# Patient Record
Sex: Female | Born: 1975 | Race: White | Hispanic: No | Marital: Single | Smoking: Never smoker
Health system: Southern US, Community
[De-identification: ages and names within clinical notes are randomized; demographics above are authoritative.]

## PROBLEM LIST (undated history)

## (undated) DIAGNOSIS — R0609 Other forms of dyspnea: Secondary | ICD-10-CM

## (undated) DIAGNOSIS — Z8249 Family history of ischemic heart disease and other diseases of the circulatory system: Principal | ICD-10-CM

## (undated) DIAGNOSIS — E039 Hypothyroidism, unspecified: Secondary | ICD-10-CM

## (undated) DIAGNOSIS — I491 Atrial premature depolarization: Secondary | ICD-10-CM

## (undated) HISTORY — DX: Other forms of dyspnea: R06.09

## (undated) HISTORY — DX: Family history of ischemic heart disease and other diseases of the circulatory system: Z82.49

## (undated) HISTORY — DX: Hypothyroidism, unspecified: E03.9

## (undated) HISTORY — DX: Atrial premature depolarization: I49.1

---

## 2001-11-10 HISTORY — PX: BREAST ENHANCEMENT SURGERY: SHX7

## 2012-05-12 ENCOUNTER — Emergency Department
Admission: EM | Admit: 2012-05-12 | Discharge: 2012-05-12 | Disposition: A | Discharge status: Home / Self Care | Payer: Self-pay | Source: Home / Self Care

## 2014-06-29 ENCOUNTER — Other Ambulatory Visit: Payer: Self-pay | Admitting: Obstetrics and Gynecology

## 2014-06-29 DIAGNOSIS — N644 Mastodynia: Secondary | ICD-10-CM

## 2014-07-03 ENCOUNTER — Ambulatory Visit
Admission: RE | Admit: 2014-07-03 | Discharge: 2014-07-03 | Disposition: A | Discharge status: Home / Self Care | Payer: BC Managed Care – PPO | Source: Ambulatory Visit | Attending: Obstetrics and Gynecology | Admitting: Obstetrics and Gynecology

## 2014-07-03 DIAGNOSIS — N644 Mastodynia: Secondary | ICD-10-CM

## 2014-07-10 ENCOUNTER — Other Ambulatory Visit: Payer: Self-pay | Admitting: Obstetrics and Gynecology

## 2014-07-10 DIAGNOSIS — Z9882 Breast implant status: Secondary | ICD-10-CM

## 2014-07-10 DIAGNOSIS — N63 Unspecified lump in unspecified breast: Secondary | ICD-10-CM

## 2014-07-11 ENCOUNTER — Other Ambulatory Visit: Payer: Self-pay | Admitting: Obstetrics and Gynecology

## 2014-07-11 DIAGNOSIS — N63 Unspecified lump in unspecified breast: Secondary | ICD-10-CM

## 2014-10-01 ENCOUNTER — Other Ambulatory Visit: Payer: BC Managed Care – PPO

## 2014-10-02 ENCOUNTER — Other Ambulatory Visit: Payer: BC Managed Care – PPO

## 2016-01-31 ENCOUNTER — Other Ambulatory Visit: Payer: Self-pay | Admitting: Obstetrics and Gynecology

## 2016-01-31 DIAGNOSIS — Z1231 Encounter for screening mammogram for malignant neoplasm of breast: Secondary | ICD-10-CM

## 2016-02-20 ENCOUNTER — Ambulatory Visit: Payer: Self-pay

## 2016-02-28 ENCOUNTER — Ambulatory Visit: Payer: Self-pay | Admitting: Cardiology

## 2016-03-19 ENCOUNTER — Ambulatory Visit
Admission: RE | Admit: 2016-03-19 | Discharge: 2016-03-19 | Disposition: A | Discharge status: Home / Self Care | Payer: BLUE CROSS/BLUE SHIELD | Source: Ambulatory Visit | Attending: Obstetrics and Gynecology | Admitting: Obstetrics and Gynecology

## 2016-03-19 DIAGNOSIS — Z1231 Encounter for screening mammogram for malignant neoplasm of breast: Secondary | ICD-10-CM | POA: Diagnosis not present

## 2016-03-24 ENCOUNTER — Ambulatory Visit (INDEPENDENT_AMBULATORY_CARE_PROVIDER_SITE_OTHER): Payer: BLUE CROSS/BLUE SHIELD | Admitting: Cardiology

## 2016-03-24 ENCOUNTER — Telehealth: Payer: Self-pay | Admitting: Cardiology

## 2016-03-24 ENCOUNTER — Encounter: Payer: Self-pay | Admitting: Cardiology

## 2016-03-24 VITALS — BP 102/70 | HR 60 | Ht 68.0 in | Wt 147.0 lb

## 2016-03-24 DIAGNOSIS — R0602 Shortness of breath: Secondary | ICD-10-CM | POA: Diagnosis not present

## 2016-03-24 DIAGNOSIS — Z8249 Family history of ischemic heart disease and other diseases of the circulatory system: Secondary | ICD-10-CM | POA: Diagnosis not present

## 2016-03-24 DIAGNOSIS — I498 Other specified cardiac arrhythmias: Secondary | ICD-10-CM | POA: Diagnosis not present

## 2016-03-24 DIAGNOSIS — R0600 Dyspnea, unspecified: Secondary | ICD-10-CM

## 2016-03-24 DIAGNOSIS — R0609 Other forms of dyspnea: Secondary | ICD-10-CM | POA: Diagnosis not present

## 2016-03-24 DIAGNOSIS — I491 Atrial premature depolarization: Secondary | ICD-10-CM

## 2016-03-24 HISTORY — DX: Dyspnea, unspecified: R06.00

## 2016-03-24 HISTORY — DX: Family history of ischemic heart disease and other diseases of the circulatory system: Z82.49

## 2016-03-24 HISTORY — DX: Atrial premature depolarization: I49.1

## 2016-03-24 HISTORY — DX: Other forms of dyspnea: R06.09

## 2016-03-24 NOTE — Progress Notes (Addendum)
Cardiology Office Note    Date:  03/24/2016   ID:  Sara Zimmerman, DOB December 30, 1975, MRN [id number]  PCP:  Damien Fusi, NP  Cardiologist:  Fransico Him, MD   Chief Complaint  Patient presents with  . New Evaluation    familiy history of CAD, cousin died at 73 of sudden heart attack    History of Present Illness:  Sara Zimmerman is a 40 y.o. female with no prior cardiac history who presents today for evaluation of family history of CAD.  She has a strong family history of premature CAD with her cousin having SCD at age 88.  Her maternal grandmother died at 28 of an MI.  Her maternal uncle died at 63 as well.  She does not think either of them smoked.  She has never smoked either.   She denies any chest pain,  LE edema, dizziness, palpitations or syncope.  She does have some problems with SOB but she thinks it is related to stress and work but does notice it some with exercise.      Past Medical History  Diagnosis Date  . Family history of premature CAD 03/24/2016  . Hypothyroidism   . DOE (dyspnea on exertion) 03/24/2016  . 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 Breast enhancement surgery  2003    Current Medications: Outpatient Prescriptions Prior to Visit  Medication Sig Dispense Refill  . levothyroxine (SYNTHROID, LEVOTHROID) 50 MCG tablet Take 50 mcg by mouth daily before breakfast.     No facility-administered medications prior to visit.     Allergies:   Review of patient's allergies indicates no known allergies.   Social History   Social History  . Marital Status: Single    Spouse Name: N/A  . Number of Children: N/A  . Years of Education: N/A   Social History Main Topics  . Smoking status: Never Smoker   . Smokeless tobacco: None  . Alcohol Use: 0.0 oz/week    0 Standard drinks or equivalent per week  . Drug Use: None  . Sexual Activity: Yes   Other Topics Concern  . None   Social History Narrative     Family History:  The patient's family history includes Heart attack in her cousin and maternal grandmother; Heart disease in her maternal grandmother and mother.   ROS:   Please see the history of present illness.    ROS All other systems reviewed and are negative.   PHYSICAL EXAM:   VS:  BP 102/70 mmHg  Pulse 60  Ht 5' 8"  (1.727 m)  Wt 147 lb (66.679 kg)  BMI 22.36 kg/m2   GEN: Well nourished, well developed, in no acute distress HEENT: normal Neck: no JVD, carotid bruits, or masses Cardiac: RRR; no murmurs, rubs, or gallops,no edema.  Intact distal pulses bilaterally.  Respiratory:  clear to auscultation bilaterally, normal work of breathing GI: soft, nontender, nondistended, + BS MS: no deformity or atrophy Skin: warm and dry, no rash Neuro:  Alert and Oriented x 3, Strength and sensation are intact Psych: euthymic mood, full affect  Wt Readings from Last 3 Encounters:  03/24/16 147 lb (66.679 kg)      Studies/Labs Reviewed:   EKG:  EKG is ordered today.  The ekg ordered today demonstrates Ectopic atrial rhythm at 60bpm with no ST changes  Recent Labs: No results found for requested labs within last 365 days.   Lipid Panel No results found for:  CHOL, TRIG, HDL, CHOLHDL, VLDL, LDLCALC, LDLDIRECT  Additional studies/ records that were reviewed today include:  OV notes from PCP    ASSESSMENT:    1. Family history of premature CAD   2. DOE (dyspnea on exertion)   3. Ectopic atrial rhythm      PLAN:  In order of problems listed above:  1. Family history of premature CAD - she has no cardiac risk factors other than the family history.  She does not smoke.  I will get an ETT to rule out ischemia and get a coronary calcium score to assess future risk.  2. DOE - I will get an ETT to rule out ischemia.  Check 2D echo to assess LVF 3. Ectopic atrial rhythm - she is asymptomatic.      Medication Adjustments/Labs and Tests Ordered: Current medicines are  reviewed at length with the patient today.  Concerns regarding medicines are outlined above.  Medication changes, Labs and Tests ordered today are listed in the Patient Instructions below.  Patient Instructions  Medication Instructions:  Your physician recommends that you continue on your current medications as directed. Please refer to the Current Medication list given to you today.   Labwork: None  Testing/Procedures: Your physician has requested that you have an exercise tolerance test. For further information please visit [email or web address]. Please also follow instruction sheet, as given.  Dr. Radford Pax recommends you have a CORONARY CALCIUM SCORE.  Follow-Up: Your physician recommends that you schedule a follow-up appointment AS NEEDED with Dr. Radford Pax pending study results.   Any Other Special Instructions Will Be Listed Below (If Applicable).     If you need a refill on your cardiac medications before your next appointment, please call your pharmacy.       Signed, Fransico Him, MD  03/24/2016 3:35 PM    [facility] [place], [place],   [address] Phone: [phone]; Fax: [phone]

## 2016-03-24 NOTE — Patient Instructions (Signed)
Medication Instructions:  Your physician recommends that you continue on your current medications as directed. Please refer to the Current Medication list given to you today.   Labwork: None  Testing/Procedures: Your physician has requested that you have an exercise tolerance test. For further information please visit [email or web address]. Please also follow instruction sheet, as given.  Dr. Radford Pax recommends you have a CORONARY CALCIUM SCORE.  Follow-Up: Your physician recommends that you schedule a follow-up appointment AS NEEDED with Dr. Radford Pax pending study results.   Any Other Special Instructions Will Be Listed Below (If Applicable).     If you need a refill on your cardiac medications before your next appointment, please call your pharmacy.

## 2016-03-24 NOTE — Telephone Encounter (Signed)
Please also order 2D echo to assess LVF and diastolic function given her SOB

## 2016-03-25 ENCOUNTER — Telehealth: Payer: Self-pay | Admitting: *Deleted

## 2016-03-25 NOTE — Telephone Encounter (Signed)
Called pt to let her know that Dr. Radford Pax wanted to add ECHO on to her testing that is scheduled for 03/27/16.  Pt actually wants to reschedule al her testing until 5/30 or 5/31.  I advised pt that I could not do that but I would have it worked on for her and call her back with her new times. Pt verbalized appreciation and understanding.

## 2016-03-25 NOTE — Telephone Encounter (Signed)
Called pt back to let her know that her Cardiac CT Scoring, ECHO, and GXT has been rescheduled to 04/09/16 (per her request) and to arrive at 9:15.   CT Scoring 9:30 ECHO 10:00 GXT 11:00. Pt was advised not to eat or drink after 7:00 that morning due to the GXT. Pt verbalized understanding.

## 2016-03-25 NOTE — Addendum Note (Signed)
Addended by: Gaetano Net on: 03/25/2016 10:38 AM   Modules accepted: Orders

## 2016-03-27 ENCOUNTER — Other Ambulatory Visit (HOSPITAL_COMMUNITY): Payer: BLUE CROSS/BLUE SHIELD

## 2016-03-27 ENCOUNTER — Other Ambulatory Visit: Payer: BLUE CROSS/BLUE SHIELD

## 2016-03-28 ENCOUNTER — Encounter: Payer: Self-pay | Admitting: Cardiology

## 2016-04-09 ENCOUNTER — Ambulatory Visit (INDEPENDENT_AMBULATORY_CARE_PROVIDER_SITE_OTHER): Payer: BLUE CROSS/BLUE SHIELD

## 2016-04-09 ENCOUNTER — Ambulatory Visit (HOSPITAL_COMMUNITY): Payer: BLUE CROSS/BLUE SHIELD | Attending: Cardiovascular Disease

## 2016-04-09 ENCOUNTER — Ambulatory Visit (INDEPENDENT_AMBULATORY_CARE_PROVIDER_SITE_OTHER)
Admission: RE | Admit: 2016-04-09 | Discharge: 2016-04-09 | Disposition: A | Discharge status: Home / Self Care | Payer: Self-pay | Source: Ambulatory Visit | Attending: Cardiology | Admitting: Cardiology

## 2016-04-09 ENCOUNTER — Other Ambulatory Visit: Payer: Self-pay

## 2016-04-09 DIAGNOSIS — Z8249 Family history of ischemic heart disease and other diseases of the circulatory system: Secondary | ICD-10-CM

## 2016-04-09 DIAGNOSIS — R0602 Shortness of breath: Secondary | ICD-10-CM

## 2016-04-09 DIAGNOSIS — R0609 Other forms of dyspnea: Secondary | ICD-10-CM | POA: Diagnosis not present

## 2016-04-09 LAB — EXERCISE TOLERANCE TEST
CHL CUP MPHR: 179 {beats}/min
CHL CUP RESTING HR STRESS: 61 {beats}/min
CHL RATE OF PERCEIVED EXERTION: 15
CSEPEW: 13.4 METS
CSEPHR: 87 %
Exercise duration (min): 12 min
Exercise duration (sec): 0 s
Peak HR: 157 {beats}/min

## 2016-04-11 ENCOUNTER — Encounter: Payer: Self-pay | Admitting: Cardiology

## 2016-04-11 NOTE — Telephone Encounter (Signed)
New message    Patient calling back stating the nurse called her this am

## 2016-04-11 NOTE — Telephone Encounter (Signed)
This encounter was created in error - please disregard.

## 2016-10-10 DIAGNOSIS — F341 Dysthymic disorder: Secondary | ICD-10-CM | POA: Diagnosis not present

## 2016-10-16 DIAGNOSIS — F341 Dysthymic disorder: Secondary | ICD-10-CM | POA: Diagnosis not present

## 2016-10-16 DIAGNOSIS — Z23 Encounter for immunization: Secondary | ICD-10-CM | POA: Diagnosis not present

## 2016-10-20 DIAGNOSIS — F341 Dysthymic disorder: Secondary | ICD-10-CM | POA: Diagnosis not present

## 2016-10-23 DIAGNOSIS — F341 Dysthymic disorder: Secondary | ICD-10-CM | POA: Diagnosis not present

## 2016-11-13 DIAGNOSIS — F341 Dysthymic disorder: Secondary | ICD-10-CM | POA: Diagnosis not present

## 2016-11-22 DIAGNOSIS — F341 Dysthymic disorder: Secondary | ICD-10-CM | POA: Diagnosis not present

## 2016-12-04 DIAGNOSIS — F341 Dysthymic disorder: Secondary | ICD-10-CM | POA: Diagnosis not present

## 2016-12-11 DIAGNOSIS — F341 Dysthymic disorder: Secondary | ICD-10-CM | POA: Diagnosis not present

## 2016-12-27 DIAGNOSIS — F341 Dysthymic disorder: Secondary | ICD-10-CM | POA: Diagnosis not present

## 2017-01-03 DIAGNOSIS — F341 Dysthymic disorder: Secondary | ICD-10-CM | POA: Diagnosis not present

## 2017-01-17 DIAGNOSIS — F341 Dysthymic disorder: Secondary | ICD-10-CM | POA: Diagnosis not present

## 2017-01-24 DIAGNOSIS — F341 Dysthymic disorder: Secondary | ICD-10-CM | POA: Diagnosis not present

## 2017-02-02 DIAGNOSIS — Z1231 Encounter for screening mammogram for malignant neoplasm of breast: Secondary | ICD-10-CM | POA: Diagnosis not present

## 2017-02-02 DIAGNOSIS — N6459 Other signs and symptoms in breast: Secondary | ICD-10-CM | POA: Diagnosis not present

## 2017-02-21 DIAGNOSIS — F341 Dysthymic disorder: Secondary | ICD-10-CM | POA: Diagnosis not present

## 2017-02-25 DIAGNOSIS — F341 Dysthymic disorder: Secondary | ICD-10-CM | POA: Diagnosis not present

## 2017-03-07 DIAGNOSIS — F341 Dysthymic disorder: Secondary | ICD-10-CM | POA: Diagnosis not present

## 2017-03-09 DIAGNOSIS — Z01419 Encounter for gynecological examination (general) (routine) without abnormal findings: Secondary | ICD-10-CM | POA: Diagnosis not present

## 2017-03-09 DIAGNOSIS — E039 Hypothyroidism, unspecified: Secondary | ICD-10-CM | POA: Diagnosis not present

## 2017-03-09 DIAGNOSIS — Z6822 Body mass index (BMI) 22.0-22.9, adult: Secondary | ICD-10-CM | POA: Diagnosis not present

## 2017-03-12 DIAGNOSIS — F341 Dysthymic disorder: Secondary | ICD-10-CM | POA: Diagnosis not present

## 2017-03-19 DIAGNOSIS — F341 Dysthymic disorder: Secondary | ICD-10-CM | POA: Diagnosis not present

## 2017-03-28 DIAGNOSIS — F341 Dysthymic disorder: Secondary | ICD-10-CM | POA: Diagnosis not present

## 2017-04-02 DIAGNOSIS — F341 Dysthymic disorder: Secondary | ICD-10-CM | POA: Diagnosis not present

## 2017-04-09 DIAGNOSIS — F341 Dysthymic disorder: Secondary | ICD-10-CM | POA: Diagnosis not present

## 2017-04-15 DIAGNOSIS — F341 Dysthymic disorder: Secondary | ICD-10-CM | POA: Diagnosis not present

## 2017-04-24 DIAGNOSIS — F341 Dysthymic disorder: Secondary | ICD-10-CM | POA: Diagnosis not present

## 2017-06-04 DIAGNOSIS — F341 Dysthymic disorder: Secondary | ICD-10-CM | POA: Diagnosis not present

## 2017-06-11 DIAGNOSIS — F341 Dysthymic disorder: Secondary | ICD-10-CM | POA: Diagnosis not present

## 2017-06-25 DIAGNOSIS — F341 Dysthymic disorder: Secondary | ICD-10-CM | POA: Diagnosis not present

## 2017-07-02 DIAGNOSIS — F341 Dysthymic disorder: Secondary | ICD-10-CM | POA: Diagnosis not present

## 2017-09-22 DIAGNOSIS — F341 Dysthymic disorder: Secondary | ICD-10-CM | POA: Diagnosis not present

## 2017-09-29 DIAGNOSIS — F341 Dysthymic disorder: Secondary | ICD-10-CM | POA: Diagnosis not present

## 2017-10-08 DIAGNOSIS — F341 Dysthymic disorder: Secondary | ICD-10-CM | POA: Diagnosis not present

## 2017-12-14 IMAGING — MG MM DIGITAL SCREENING W/ IMPLANTS BILATERAL
8 series · 8 of 8 positions shown · non-contrast
Comparison: Previous exam(s).

CLINICAL DATA: Screening.

EXAM:
DIGITAL SCREENING BILATERAL MAMMOGRAM WITH IMPLANTS AND CAD
The patient has retropectoral implants. Standard and implant
displaced views were performed.

[L MLO (1 of 2)]
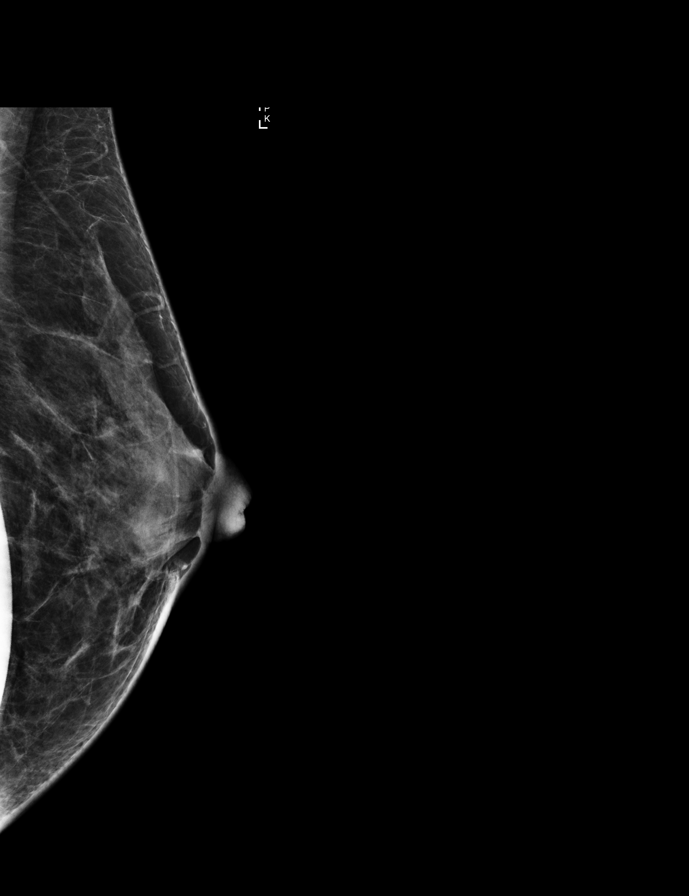

[R CC (1 of 2)]
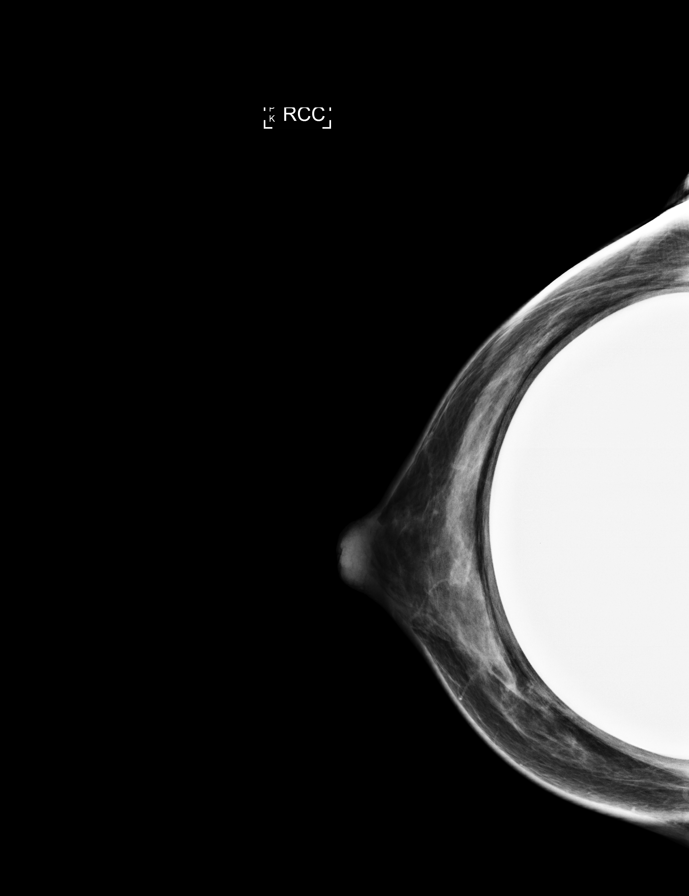

[L CC (1 of 2)]
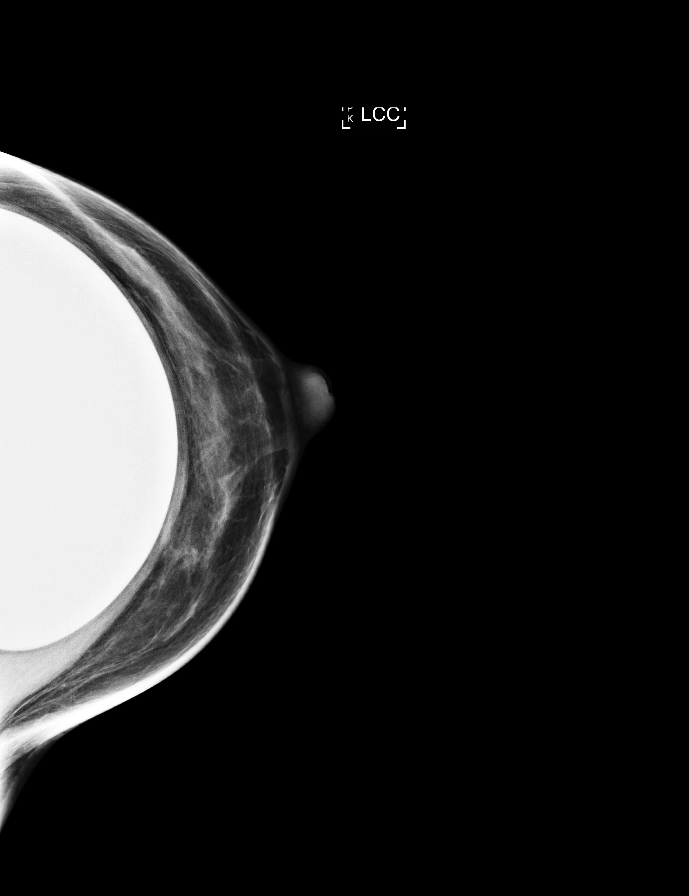

[L MLO (2 of 2)]
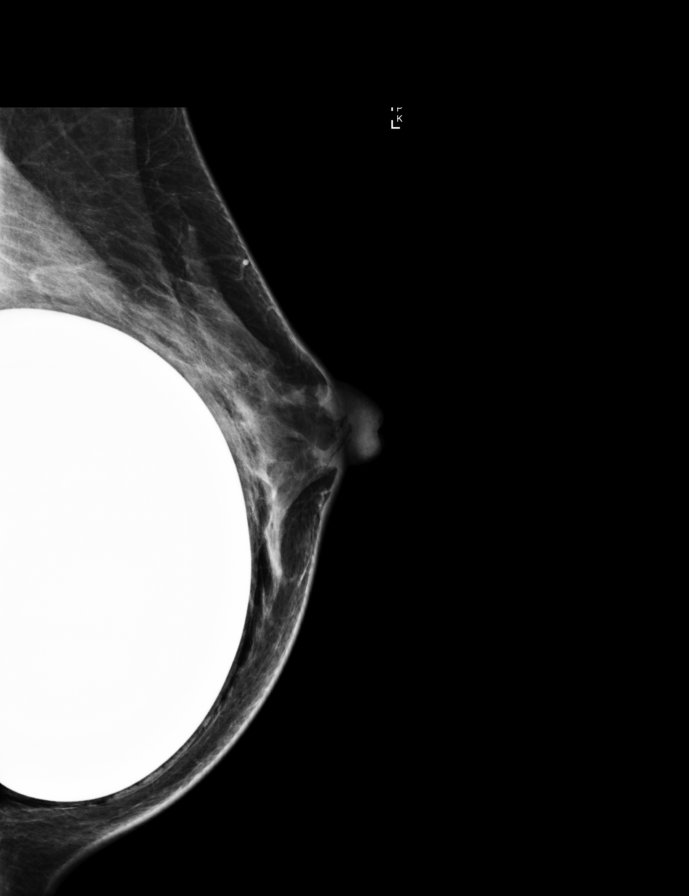

[R CC (2 of 2)]
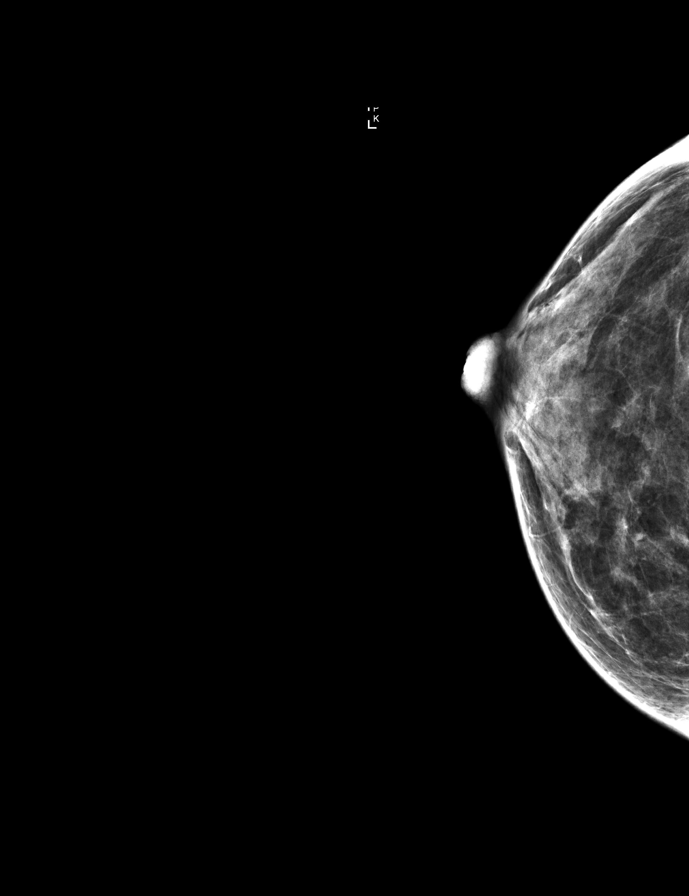

[L CC (2 of 2)]
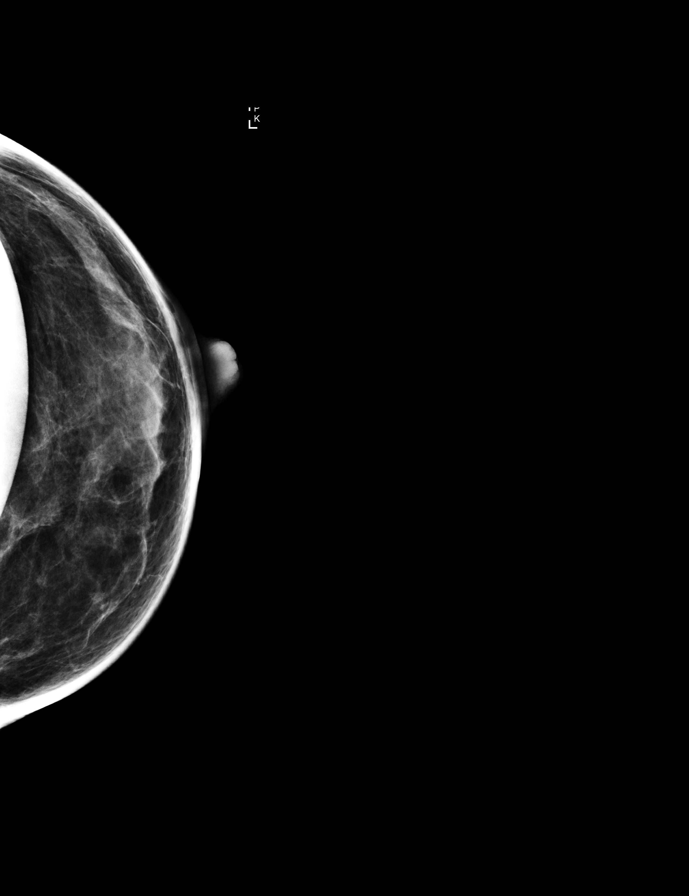

[R MLO (1 of 2)]
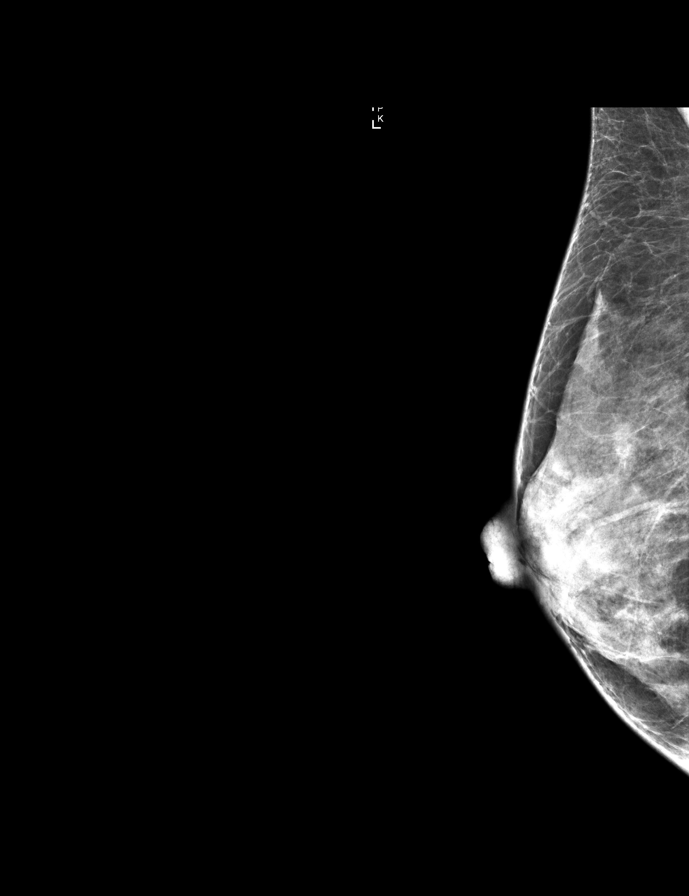

[R MLO (2 of 2)]
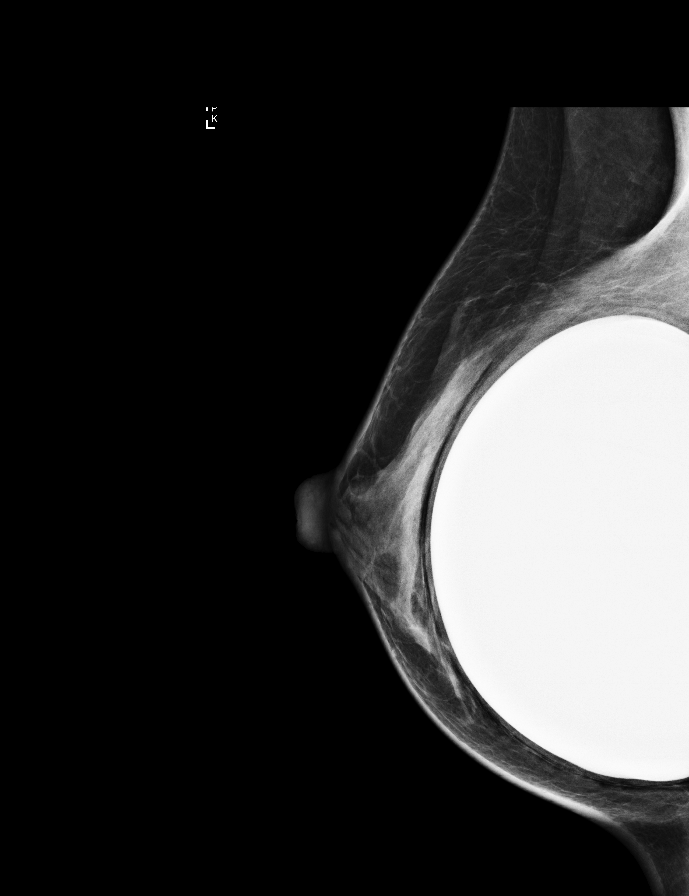

[8 of 8 positions shown; findings below may reference images not displayed]

ACR Breast Density Category c: The breast tissue is heterogeneously
dense, which may obscure small masses.
FINDINGS: There are no findings suspicious for malignancy. Images were
processed with CAD.
IMPRESSION: No mammographic evidence of malignancy. A result letter of this
screening mammogram will be mailed directly to the patient.

RECOMMENDATION:
Screening mammogram in one year. (Code:R9-8-RFM)

BI-RADS CATEGORY  1:  Negative.

## 2017-12-25 DIAGNOSIS — Z0000 Encounter for general adult medical examination without abnormal findings: Secondary | ICD-10-CM | POA: Diagnosis not present

## 2017-12-25 DIAGNOSIS — Z7689 Persons encountering health services in other specified circumstances: Secondary | ICD-10-CM | POA: Diagnosis not present

## 2017-12-31 DIAGNOSIS — Z0000 Encounter for general adult medical examination without abnormal findings: Secondary | ICD-10-CM | POA: Diagnosis not present

## 2017-12-31 DIAGNOSIS — N390 Urinary tract infection, site not specified: Secondary | ICD-10-CM | POA: Diagnosis not present

## 2017-12-31 DIAGNOSIS — Z7689 Persons encountering health services in other specified circumstances: Secondary | ICD-10-CM | POA: Diagnosis not present

## 2018-07-13 DIAGNOSIS — Z6822 Body mass index (BMI) 22.0-22.9, adult: Secondary | ICD-10-CM | POA: Diagnosis not present

## 2018-07-13 DIAGNOSIS — Z01419 Encounter for gynecological examination (general) (routine) without abnormal findings: Secondary | ICD-10-CM | POA: Diagnosis not present

## 2018-07-13 DIAGNOSIS — Z1231 Encounter for screening mammogram for malignant neoplasm of breast: Secondary | ICD-10-CM | POA: Diagnosis not present

## 2018-07-13 DIAGNOSIS — N760 Acute vaginitis: Secondary | ICD-10-CM | POA: Diagnosis not present

## 2018-07-14 DIAGNOSIS — M41126 Adolescent idiopathic scoliosis, lumbar region: Secondary | ICD-10-CM | POA: Diagnosis not present

## 2018-07-14 DIAGNOSIS — M9901 Segmental and somatic dysfunction of cervical region: Secondary | ICD-10-CM | POA: Diagnosis not present

## 2018-07-14 DIAGNOSIS — R293 Abnormal posture: Secondary | ICD-10-CM | POA: Diagnosis not present

## 2018-07-14 DIAGNOSIS — M41124 Adolescent idiopathic scoliosis, thoracic region: Secondary | ICD-10-CM | POA: Diagnosis not present

## 2018-07-20 DIAGNOSIS — M41126 Adolescent idiopathic scoliosis, lumbar region: Secondary | ICD-10-CM | POA: Diagnosis not present

## 2018-07-20 DIAGNOSIS — M9901 Segmental and somatic dysfunction of cervical region: Secondary | ICD-10-CM | POA: Diagnosis not present

## 2018-07-20 DIAGNOSIS — R293 Abnormal posture: Secondary | ICD-10-CM | POA: Diagnosis not present

## 2018-07-20 DIAGNOSIS — M41124 Adolescent idiopathic scoliosis, thoracic region: Secondary | ICD-10-CM | POA: Diagnosis not present

## 2018-07-27 DIAGNOSIS — R293 Abnormal posture: Secondary | ICD-10-CM | POA: Diagnosis not present

## 2018-07-27 DIAGNOSIS — M9901 Segmental and somatic dysfunction of cervical region: Secondary | ICD-10-CM | POA: Diagnosis not present

## 2018-07-27 DIAGNOSIS — M41124 Adolescent idiopathic scoliosis, thoracic region: Secondary | ICD-10-CM | POA: Diagnosis not present

## 2018-07-27 DIAGNOSIS — M41126 Adolescent idiopathic scoliosis, lumbar region: Secondary | ICD-10-CM | POA: Diagnosis not present

## 2018-07-29 DIAGNOSIS — M41126 Adolescent idiopathic scoliosis, lumbar region: Secondary | ICD-10-CM | POA: Diagnosis not present

## 2018-07-29 DIAGNOSIS — M41124 Adolescent idiopathic scoliosis, thoracic region: Secondary | ICD-10-CM | POA: Diagnosis not present

## 2018-07-29 DIAGNOSIS — M9901 Segmental and somatic dysfunction of cervical region: Secondary | ICD-10-CM | POA: Diagnosis not present

## 2018-07-29 DIAGNOSIS — R293 Abnormal posture: Secondary | ICD-10-CM | POA: Diagnosis not present

## 2018-08-03 DIAGNOSIS — R293 Abnormal posture: Secondary | ICD-10-CM | POA: Diagnosis not present

## 2018-08-03 DIAGNOSIS — R41840 Attention and concentration deficit: Secondary | ICD-10-CM | POA: Diagnosis not present

## 2018-08-03 DIAGNOSIS — M41126 Adolescent idiopathic scoliosis, lumbar region: Secondary | ICD-10-CM | POA: Diagnosis not present

## 2018-08-03 DIAGNOSIS — Z79899 Other long term (current) drug therapy: Secondary | ICD-10-CM | POA: Diagnosis not present

## 2018-08-03 DIAGNOSIS — M9901 Segmental and somatic dysfunction of cervical region: Secondary | ICD-10-CM | POA: Diagnosis not present

## 2018-08-03 DIAGNOSIS — M41124 Adolescent idiopathic scoliosis, thoracic region: Secondary | ICD-10-CM | POA: Diagnosis not present

## 2018-08-03 DIAGNOSIS — F902 Attention-deficit hyperactivity disorder, combined type: Secondary | ICD-10-CM | POA: Diagnosis not present

## 2018-08-04 DIAGNOSIS — R41840 Attention and concentration deficit: Secondary | ICD-10-CM | POA: Diagnosis not present

## 2018-08-04 DIAGNOSIS — Z79899 Other long term (current) drug therapy: Secondary | ICD-10-CM | POA: Diagnosis not present

## 2018-08-06 DIAGNOSIS — M9901 Segmental and somatic dysfunction of cervical region: Secondary | ICD-10-CM | POA: Diagnosis not present

## 2018-08-06 DIAGNOSIS — M41124 Adolescent idiopathic scoliosis, thoracic region: Secondary | ICD-10-CM | POA: Diagnosis not present

## 2018-08-06 DIAGNOSIS — R293 Abnormal posture: Secondary | ICD-10-CM | POA: Diagnosis not present

## 2018-08-06 DIAGNOSIS — M41126 Adolescent idiopathic scoliosis, lumbar region: Secondary | ICD-10-CM | POA: Diagnosis not present

## 2018-08-09 DIAGNOSIS — R293 Abnormal posture: Secondary | ICD-10-CM | POA: Diagnosis not present

## 2018-08-09 DIAGNOSIS — M9901 Segmental and somatic dysfunction of cervical region: Secondary | ICD-10-CM | POA: Diagnosis not present

## 2018-08-09 DIAGNOSIS — M41124 Adolescent idiopathic scoliosis, thoracic region: Secondary | ICD-10-CM | POA: Diagnosis not present

## 2018-08-09 DIAGNOSIS — M41126 Adolescent idiopathic scoliosis, lumbar region: Secondary | ICD-10-CM | POA: Diagnosis not present

## 2018-08-12 DIAGNOSIS — M9901 Segmental and somatic dysfunction of cervical region: Secondary | ICD-10-CM | POA: Diagnosis not present

## 2018-08-12 DIAGNOSIS — M41124 Adolescent idiopathic scoliosis, thoracic region: Secondary | ICD-10-CM | POA: Diagnosis not present

## 2018-08-12 DIAGNOSIS — R293 Abnormal posture: Secondary | ICD-10-CM | POA: Diagnosis not present

## 2018-08-12 DIAGNOSIS — M41126 Adolescent idiopathic scoliosis, lumbar region: Secondary | ICD-10-CM | POA: Diagnosis not present

## 2018-08-17 DIAGNOSIS — M41124 Adolescent idiopathic scoliosis, thoracic region: Secondary | ICD-10-CM | POA: Diagnosis not present

## 2018-08-17 DIAGNOSIS — M41126 Adolescent idiopathic scoliosis, lumbar region: Secondary | ICD-10-CM | POA: Diagnosis not present

## 2018-08-17 DIAGNOSIS — R293 Abnormal posture: Secondary | ICD-10-CM | POA: Diagnosis not present

## 2018-08-17 DIAGNOSIS — M9901 Segmental and somatic dysfunction of cervical region: Secondary | ICD-10-CM | POA: Diagnosis not present

## 2018-08-19 DIAGNOSIS — R293 Abnormal posture: Secondary | ICD-10-CM | POA: Diagnosis not present

## 2018-08-19 DIAGNOSIS — M9901 Segmental and somatic dysfunction of cervical region: Secondary | ICD-10-CM | POA: Diagnosis not present

## 2018-08-19 DIAGNOSIS — M41126 Adolescent idiopathic scoliosis, lumbar region: Secondary | ICD-10-CM | POA: Diagnosis not present

## 2018-08-19 DIAGNOSIS — M41124 Adolescent idiopathic scoliosis, thoracic region: Secondary | ICD-10-CM | POA: Diagnosis not present

## 2018-09-01 DIAGNOSIS — R293 Abnormal posture: Secondary | ICD-10-CM | POA: Diagnosis not present

## 2018-09-01 DIAGNOSIS — M41126 Adolescent idiopathic scoliosis, lumbar region: Secondary | ICD-10-CM | POA: Diagnosis not present

## 2018-09-01 DIAGNOSIS — M41124 Adolescent idiopathic scoliosis, thoracic region: Secondary | ICD-10-CM | POA: Diagnosis not present

## 2018-09-01 DIAGNOSIS — M9901 Segmental and somatic dysfunction of cervical region: Secondary | ICD-10-CM | POA: Diagnosis not present

## 2018-09-06 DIAGNOSIS — M41126 Adolescent idiopathic scoliosis, lumbar region: Secondary | ICD-10-CM | POA: Diagnosis not present

## 2018-09-06 DIAGNOSIS — M41124 Adolescent idiopathic scoliosis, thoracic region: Secondary | ICD-10-CM | POA: Diagnosis not present

## 2018-09-06 DIAGNOSIS — R293 Abnormal posture: Secondary | ICD-10-CM | POA: Diagnosis not present

## 2018-09-06 DIAGNOSIS — M9901 Segmental and somatic dysfunction of cervical region: Secondary | ICD-10-CM | POA: Diagnosis not present

## 2018-09-08 DIAGNOSIS — R293 Abnormal posture: Secondary | ICD-10-CM | POA: Diagnosis not present

## 2018-09-08 DIAGNOSIS — F419 Anxiety disorder, unspecified: Secondary | ICD-10-CM | POA: Diagnosis not present

## 2018-09-08 DIAGNOSIS — M41124 Adolescent idiopathic scoliosis, thoracic region: Secondary | ICD-10-CM | POA: Diagnosis not present

## 2018-09-08 DIAGNOSIS — M41126 Adolescent idiopathic scoliosis, lumbar region: Secondary | ICD-10-CM | POA: Diagnosis not present

## 2018-09-08 DIAGNOSIS — M9901 Segmental and somatic dysfunction of cervical region: Secondary | ICD-10-CM | POA: Diagnosis not present

## 2018-09-08 DIAGNOSIS — F902 Attention-deficit hyperactivity disorder, combined type: Secondary | ICD-10-CM | POA: Diagnosis not present

## 2018-09-08 DIAGNOSIS — Z79899 Other long term (current) drug therapy: Secondary | ICD-10-CM | POA: Diagnosis not present

## 2018-12-17 DIAGNOSIS — F902 Attention-deficit hyperactivity disorder, combined type: Secondary | ICD-10-CM | POA: Diagnosis not present

## 2018-12-17 DIAGNOSIS — Z79899 Other long term (current) drug therapy: Secondary | ICD-10-CM | POA: Diagnosis not present

## 2019-04-11 DIAGNOSIS — Z79899 Other long term (current) drug therapy: Secondary | ICD-10-CM | POA: Diagnosis not present

## 2019-04-11 DIAGNOSIS — F902 Attention-deficit hyperactivity disorder, combined type: Secondary | ICD-10-CM | POA: Diagnosis not present

## 2019-07-14 DIAGNOSIS — F411 Generalized anxiety disorder: Secondary | ICD-10-CM | POA: Diagnosis not present

## 2019-07-19 DIAGNOSIS — F411 Generalized anxiety disorder: Secondary | ICD-10-CM | POA: Diagnosis not present

## 2019-07-21 DIAGNOSIS — F411 Generalized anxiety disorder: Secondary | ICD-10-CM | POA: Diagnosis not present

## 2019-07-27 DIAGNOSIS — F411 Generalized anxiety disorder: Secondary | ICD-10-CM | POA: Diagnosis not present

## 2019-07-27 DIAGNOSIS — F338 Other recurrent depressive disorders: Secondary | ICD-10-CM | POA: Diagnosis not present

## 2019-07-27 DIAGNOSIS — F902 Attention-deficit hyperactivity disorder, combined type: Secondary | ICD-10-CM | POA: Diagnosis not present

## 2019-07-27 DIAGNOSIS — Z79899 Other long term (current) drug therapy: Secondary | ICD-10-CM | POA: Diagnosis not present

## 2019-07-27 DIAGNOSIS — F419 Anxiety disorder, unspecified: Secondary | ICD-10-CM | POA: Diagnosis not present

## 2019-07-28 DIAGNOSIS — F411 Generalized anxiety disorder: Secondary | ICD-10-CM | POA: Diagnosis not present

## 2019-08-04 DIAGNOSIS — F411 Generalized anxiety disorder: Secondary | ICD-10-CM | POA: Diagnosis not present

## 2019-08-09 DIAGNOSIS — F411 Generalized anxiety disorder: Secondary | ICD-10-CM | POA: Diagnosis not present

## 2019-08-11 DIAGNOSIS — F411 Generalized anxiety disorder: Secondary | ICD-10-CM | POA: Diagnosis not present

## 2019-08-15 DIAGNOSIS — F411 Generalized anxiety disorder: Secondary | ICD-10-CM | POA: Diagnosis not present

## 2019-08-17 DIAGNOSIS — F411 Generalized anxiety disorder: Secondary | ICD-10-CM | POA: Diagnosis not present

## 2019-08-22 DIAGNOSIS — F411 Generalized anxiety disorder: Secondary | ICD-10-CM | POA: Diagnosis not present

## 2019-09-05 DIAGNOSIS — F411 Generalized anxiety disorder: Secondary | ICD-10-CM | POA: Diagnosis not present

## 2019-09-07 DIAGNOSIS — F411 Generalized anxiety disorder: Secondary | ICD-10-CM | POA: Diagnosis not present

## 2019-09-08 DIAGNOSIS — E039 Hypothyroidism, unspecified: Secondary | ICD-10-CM | POA: Diagnosis not present

## 2019-09-08 DIAGNOSIS — E559 Vitamin D deficiency, unspecified: Secondary | ICD-10-CM | POA: Diagnosis not present

## 2019-09-08 DIAGNOSIS — E785 Hyperlipidemia, unspecified: Secondary | ICD-10-CM | POA: Diagnosis not present

## 2019-09-08 DIAGNOSIS — I10 Essential (primary) hypertension: Secondary | ICD-10-CM | POA: Diagnosis not present

## 2019-09-08 DIAGNOSIS — Z0000 Encounter for general adult medical examination without abnormal findings: Secondary | ICD-10-CM | POA: Diagnosis not present

## 2019-09-08 DIAGNOSIS — Z124 Encounter for screening for malignant neoplasm of cervix: Secondary | ICD-10-CM | POA: Diagnosis not present

## 2019-09-08 DIAGNOSIS — Z23 Encounter for immunization: Secondary | ICD-10-CM | POA: Diagnosis not present

## 2019-09-08 DIAGNOSIS — Z1151 Encounter for screening for human papillomavirus (HPV): Secondary | ICD-10-CM | POA: Diagnosis not present

## 2019-09-09 ENCOUNTER — Other Ambulatory Visit: Payer: Self-pay | Admitting: Internal Medicine

## 2019-09-09 DIAGNOSIS — N644 Mastodynia: Secondary | ICD-10-CM

## 2019-09-21 ENCOUNTER — Other Ambulatory Visit: Payer: BLUE CROSS/BLUE SHIELD

## 2019-09-23 ENCOUNTER — Other Ambulatory Visit: Payer: Self-pay

## 2019-09-23 ENCOUNTER — Other Ambulatory Visit: Payer: Self-pay | Admitting: Internal Medicine

## 2019-09-23 ENCOUNTER — Ambulatory Visit
Admission: RE | Admit: 2019-09-23 | Discharge: 2019-09-23 | Disposition: A | Discharge status: Home / Self Care | Payer: BC Managed Care – PPO | Source: Ambulatory Visit | Attending: Internal Medicine | Admitting: Internal Medicine

## 2019-09-23 ENCOUNTER — Ambulatory Visit: Payer: Self-pay

## 2019-09-23 DIAGNOSIS — R922 Inconclusive mammogram: Secondary | ICD-10-CM | POA: Diagnosis not present

## 2019-09-23 DIAGNOSIS — N644 Mastodynia: Secondary | ICD-10-CM

## 2019-09-29 DIAGNOSIS — F411 Generalized anxiety disorder: Secondary | ICD-10-CM | POA: Diagnosis not present

## 2019-10-04 DIAGNOSIS — F411 Generalized anxiety disorder: Secondary | ICD-10-CM | POA: Diagnosis not present

## 2019-10-10 DIAGNOSIS — F411 Generalized anxiety disorder: Secondary | ICD-10-CM | POA: Diagnosis not present

## 2019-10-13 DIAGNOSIS — F411 Generalized anxiety disorder: Secondary | ICD-10-CM | POA: Diagnosis not present

## 2019-10-17 DIAGNOSIS — F411 Generalized anxiety disorder: Secondary | ICD-10-CM | POA: Diagnosis not present

## 2019-10-20 DIAGNOSIS — F411 Generalized anxiety disorder: Secondary | ICD-10-CM | POA: Diagnosis not present

## 2019-10-24 DIAGNOSIS — F338 Other recurrent depressive disorders: Secondary | ICD-10-CM | POA: Diagnosis not present

## 2019-10-24 DIAGNOSIS — Z79899 Other long term (current) drug therapy: Secondary | ICD-10-CM | POA: Diagnosis not present

## 2019-10-24 DIAGNOSIS — F419 Anxiety disorder, unspecified: Secondary | ICD-10-CM | POA: Diagnosis not present

## 2019-10-24 DIAGNOSIS — F902 Attention-deficit hyperactivity disorder, combined type: Secondary | ICD-10-CM | POA: Diagnosis not present

## 2019-10-25 DIAGNOSIS — F411 Generalized anxiety disorder: Secondary | ICD-10-CM | POA: Diagnosis not present
